# Patient Record
Sex: Female | Born: 1965 | Race: White | Hispanic: No | Smoking: Former smoker
Health system: Southern US, Community
[De-identification: ages and names within clinical notes are randomized; demographics above are authoritative.]

---

## 1998-12-19 ENCOUNTER — Other Ambulatory Visit: Admission: RE | Admit: 1998-12-19 | Discharge: 1998-12-19 | Payer: Self-pay | Admitting: Obstetrics & Gynecology

## 2000-02-23 ENCOUNTER — Other Ambulatory Visit: Admission: RE | Admit: 2000-02-23 | Discharge: 2000-02-23 | Payer: Self-pay | Admitting: Obstetrics & Gynecology

## 2001-09-09 ENCOUNTER — Other Ambulatory Visit: Admission: RE | Admit: 2001-09-09 | Discharge: 2001-09-09 | Payer: Self-pay | Admitting: Obstetrics & Gynecology

## 2002-11-05 ENCOUNTER — Other Ambulatory Visit: Admission: RE | Admit: 2002-11-05 | Discharge: 2002-11-05 | Payer: Self-pay | Admitting: Obstetrics & Gynecology

## 2003-11-15 ENCOUNTER — Other Ambulatory Visit: Admission: RE | Admit: 2003-11-15 | Discharge: 2003-11-15 | Payer: Self-pay | Admitting: Obstetrics & Gynecology

## 2004-02-15 ENCOUNTER — Inpatient Hospital Stay (HOSPITAL_COMMUNITY): Admission: RE | Admit: 2004-02-15 | Discharge: 2004-02-16 | Payer: Self-pay | Admitting: Obstetrics & Gynecology

## 2005-01-03 ENCOUNTER — Other Ambulatory Visit: Admission: RE | Admit: 2005-01-03 | Discharge: 2005-01-03 | Payer: Self-pay | Admitting: Obstetrics & Gynecology

## 2006-02-04 ENCOUNTER — Encounter: Admission: RE | Admit: 2006-02-04 | Discharge: 2006-02-04 | Payer: Self-pay | Admitting: Obstetrics & Gynecology

## 2010-04-29 ENCOUNTER — Encounter: Payer: Self-pay | Admitting: Obstetrics & Gynecology

## 2014-09-28 ENCOUNTER — Other Ambulatory Visit: Payer: Self-pay | Admitting: Obstetrics & Gynecology

## 2014-09-29 LAB — CYTOLOGY - PAP

## 2014-10-01 ENCOUNTER — Other Ambulatory Visit: Payer: Self-pay | Admitting: Obstetrics & Gynecology

## 2014-10-01 DIAGNOSIS — R928 Other abnormal and inconclusive findings on diagnostic imaging of breast: Secondary | ICD-10-CM

## 2014-10-07 ENCOUNTER — Ambulatory Visit
Admission: RE | Admit: 2014-10-07 | Discharge: 2014-10-07 | Disposition: A | Discharge status: Home / Self Care | Payer: BC Managed Care – PPO | Source: Ambulatory Visit | Attending: Obstetrics & Gynecology | Admitting: Obstetrics & Gynecology

## 2014-10-07 DIAGNOSIS — R928 Other abnormal and inconclusive findings on diagnostic imaging of breast: Secondary | ICD-10-CM

## 2021-02-08 ENCOUNTER — Other Ambulatory Visit: Payer: Self-pay

## 2021-02-08 ENCOUNTER — Ambulatory Visit (INDEPENDENT_AMBULATORY_CARE_PROVIDER_SITE_OTHER): Payer: BC Managed Care – PPO

## 2021-02-08 ENCOUNTER — Encounter: Payer: Self-pay | Admitting: Emergency Medicine

## 2021-02-08 ENCOUNTER — Ambulatory Visit: Payer: BC Managed Care – PPO | Admitting: Emergency Medicine

## 2021-02-08 DIAGNOSIS — R053 Chronic cough: Secondary | ICD-10-CM

## 2021-02-08 DIAGNOSIS — J449 Chronic obstructive pulmonary disease, unspecified: Secondary | ICD-10-CM | POA: Diagnosis not present

## 2021-02-08 NOTE — Addendum Note (Signed)
Addended by: Arlyss Repress on: 02/08/2021 10:56 AM   Modules accepted: Orders

## 2021-02-08 NOTE — Assessment & Plan Note (Signed)
Diagnosed clinically.  She does have significant tobacco history.  Suspect she does have some degree of obstructive lung disease given her response to albuterol, possibly also to [facility].  She needs pulmonary function testing to quantify her degree of obstruction.  We may decide to start an alternative bronchodilator going forward.  Continue her albuterol for now.

## 2021-02-08 NOTE — Patient Instructions (Signed)
Will temporarily increase your omeprazole to 20 mg twice a day.  Take this medication 1 hour around food. Keep the head of your bed elevated We will provide you with a GERD prevention diet Stop your Wixela for now.  We will decide whether to restart scheduled inhaler medication in the future. Keep your albuterol available to use 2 puffs when needed for shortness of breath, chest tightness, wheezing. Do your best to avoid throat clearing.  You may benefit from using a sugar-free candy or not methylated cough drop.  When you have the urge to clear your throat, just swallow Try to practice voice rest if possible We will perform full pulmonary function testing in next office visit. Chest x-ray today Follow with Dr. Delton Coombes next available with full pulmonary function testing on the same day.

## 2021-02-08 NOTE — Progress Notes (Signed)
Subjective:    Patient ID: Brenda Key, female    DOB: 03-06-1966, 55 y.o.   MRN: [id number]  HPI 55 year old smoker (19 pack years) with a history of hypertension, GERD, hyperlipidemia, vitamin D deficiency.  She carries a diagnosis of asthma that was made in young adulthood.  She has an albuterol inhaler, Wixela (started 3 months ago), montelukast. Using albuterol less than once a day.  She has cough, often in the am, rarely productive. Can be a barking hard cough. Has to clear her throat a lot - has been present for several years. Was started on omeprazole about 1 month ago. She can intermittently have chest tightness. Over the years has had to have steroids from time to time. She does have reflux, usually in the mornings. No real PND sx.   Chest x-ray 04/27/2020, report available was read as normal at [facility].   Review of Systems As per HPI  PMH: Hypertension GERD Hyperlipidemia Vitamin D deficiency Moderate persistent asthma Obesity Carpal tunnel syndrome   Family history: CAD Father Seizures Brother CAD mother Asthma mother Alzheimer's mother Diabetes maternal aunt   Social History   Socioeconomic History   Marital status: Married    Spouse name: Not on file   Number of children: Not on file   Years of education: Not on file   Highest education level: Not on file  Occupational History   Not on file  Tobacco Use   Smoking status: Not on file   Smokeless tobacco: Not on file  Substance and Sexual Activity   Alcohol use: Not on file   Drug use: Not on file   Sexual activity: Not on file  Other Topics Concern   Not on file  Social History Narrative   Not on file   Social Determinants of Health   Financial Resource Strain: Not on file  Food Insecurity: Not on file  Transportation Needs: Not on file  Physical Activity: Not on file  Stress: Not on file  Social Connections: Not on file  Intimate Partner Violence: Not on file     Not on File    Outpatient Medications Prior to Visit  Medication Sig Dispense Refill   albuterol (VENTOLIN HFA) 108 (90 Base) MCG/ACT inhaler SMARTSIG:2 Puff(s) By Mouth Every 6 Hours PRN     amLODipine (NORVASC) 2.5 MG tablet Take by mouth.     fluticasone-salmeterol (ADVAIR) 100-50 MCG/ACT AEPB Inhale into the lungs.     omeprazole (PRILOSEC) 20 MG capsule Take by mouth.     rosuvastatin (CRESTOR) 5 MG tablet Take 1 tablet by mouth at bedtime.     No facility-administered medications prior to visit.         Objective:   Physical Exam  Vitals:   02/08/21 1020  BP: 118/66  Pulse: 73  Temp: 98.6 F (37 C)  TempSrc: Oral  SpO2: 96%  Weight: 175 lb 9.6 oz (79.7 kg)  Height: 5\' 3"  (1.6 m)    Gen: Pleasant, obese woman, in no distress,  normal affect  ENT: No lesions,  mouth clear,  oropharynx clear, no postnasal drip,  Neck: No JVD, no stridor  Lungs: No use of accessory muscles, no crackles or wheezing on normal respiration, no wheeze on forced expiration  Cardiovascular: RRR, heart sounds normal, no murmur or gallops, no peripheral edema  Musculoskeletal: No deformities, no cyanosis or clubbing  Neuro: alert, awake, non focal  Skin: Warm, no lesions or rash     Assessment & Plan:  Chronic cough 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  She has GERD which is the principal precipitant of her upper airway irritation.  A lot of throat clearing.  We need to try to treat the GERD, tease out how much obstructive lung disease is present.  For now I will stop the [facility] since it is powdered and may cause upper airway irritation.  Will temporarily increase your omeprazole to 20 mg twice a day.  Take this medication 1 hour around food. Keep the head of your bed elevated We will provide you with a GERD prevention diet Stop your Wixela for now.  We will decide whether to restart scheduled inhaler medication in the future. Keep  your albuterol available to use 2 puffs when needed for shortness of breath, chest tightness, wheezing. Do your best to avoid throat clearing.  You may benefit from using a sugar-free candy or not methylated cough drop.  When you have the urge to clear your throat, just swallow Try to practice voice rest if possible We will perform full pulmonary function testing in next office visit. Chest x-ray today Follow with Dr. Delton Coombes next available with full pulmonary function testing on the same day.   COPD with asthma (HCC) Diagnosed clinically.  She does have significant tobacco history.  Suspect she does have some degree of obstructive lung disease given her response to albuterol, possibly also to [facility].  She needs pulmonary function testing to quantify her degree of obstruction.  We may decide to start an alternative bronchodilator going forward.  Continue her albuterol for now.   Levy Pupa, MD, PhD 02/08/2021, 10:49 AM [facility] [phone] or if no answer before 7:00PM call [phone] For any issues after 7:00PM please call eLink [phone]

## 2021-02-08 NOTE — Assessment & Plan Note (Signed)
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  She has GERD which is the principal precipitant of her upper airway irritation.  A lot of throat clearing.  We need to try to treat the GERD, tease out how much obstructive lung disease is present.  For now I will stop the [facility] since it is powdered and may cause upper airway irritation.  Will temporarily increase your omeprazole to 20 mg twice a day.  Take this medication 1 hour around food. Keep the head of your bed elevated We will provide you with a GERD prevention diet Stop your Wixela for now.  We will decide whether to restart scheduled inhaler medication in the future. Keep your albuterol available to use 2 puffs when needed for shortness of breath, chest tightness, wheezing. Do your best to avoid throat clearing.  You may benefit from using a sugar-free candy or not methylated cough drop.  When you have the urge to clear your throat, just swallow Try to practice voice rest if possible We will perform full pulmonary function testing in next office visit. Chest x-ray today Follow with Dr. Delton Coombes next available with full pulmonary function testing on the same day.

## 2021-03-31 ENCOUNTER — Other Ambulatory Visit: Payer: Self-pay | Admitting: *Deleted

## 2021-03-31 DIAGNOSIS — J449 Chronic obstructive pulmonary disease, unspecified: Secondary | ICD-10-CM

## 2021-04-04 ENCOUNTER — Other Ambulatory Visit: Payer: Self-pay

## 2021-04-04 ENCOUNTER — Ambulatory Visit: Payer: BC Managed Care – PPO | Admitting: Emergency Medicine

## 2021-04-04 ENCOUNTER — Encounter: Payer: Self-pay | Admitting: Emergency Medicine

## 2021-04-04 ENCOUNTER — Ambulatory Visit (INDEPENDENT_AMBULATORY_CARE_PROVIDER_SITE_OTHER): Payer: BC Managed Care – PPO | Admitting: Emergency Medicine

## 2021-04-04 DIAGNOSIS — F17210 Nicotine dependence, cigarettes, uncomplicated: Secondary | ICD-10-CM | POA: Diagnosis not present

## 2021-04-04 DIAGNOSIS — R053 Chronic cough: Secondary | ICD-10-CM | POA: Diagnosis not present

## 2021-04-04 DIAGNOSIS — J4489 Other specified chronic obstructive pulmonary disease: Secondary | ICD-10-CM

## 2021-04-04 DIAGNOSIS — J449 Chronic obstructive pulmonary disease, unspecified: Secondary | ICD-10-CM | POA: Diagnosis not present

## 2021-04-04 DIAGNOSIS — Z720 Tobacco use: Secondary | ICD-10-CM

## 2021-04-04 LAB — PULMONARY FUNCTION TEST
DL/VA % pred: 129 %
DL/VA: 5.52 ml/min/mmHg/L
DLCO cor % pred: 103 %
DLCO cor: 20.86 ml/min/mmHg
DLCO unc % pred: 103 %
DLCO unc: 20.86 ml/min/mmHg
FEF 25-75 Post: 1.38 L/sec
FEF 25-75 Pre: 1.4 L/sec
FEF2575-%Change-Post: -1 %
FEF2575-%Pred-Post: 56 %
FEF2575-%Pred-Pre: 57 %
FEV1-%Change-Post: -1 %
FEV1-%Pred-Post: 68 %
FEV1-%Pred-Pre: 69 %
FEV1-Post: 1.77 L
FEV1-Pre: 1.8 L
FEV1FVC-%Change-Post: 1 %
FEV1FVC-%Pred-Pre: 96 %
FEV6-%Change-Post: -3 %
FEV6-%Pred-Post: 71 %
FEV6-%Pred-Pre: 73 %
FEV6-Post: 2.29 L
FEV6-Pre: 2.36 L
FEV6FVC-%Pred-Post: 103 %
FEV6FVC-%Pred-Pre: 103 %
FVC-%Change-Post: -3 %
FVC-%Pred-Post: 68 %
FVC-%Pred-Pre: 70 %
FVC-Post: 2.29 L
FVC-Pre: 2.36 L
Post FEV1/FVC ratio: 77 %
Post FEV6/FVC ratio: 100 %
Pre FEV1/FVC ratio: 76 %
Pre FEV6/FVC Ratio: 100 %
RV % pred: 154 %
RV: 2.95 L
TLC % pred: 103 %
TLC: 5.17 L

## 2021-04-04 MED ORDER — BREZTRI AEROSPHERE 160-9-4.8 MCG/ACT IN AERO: 2.0000 | INHALATION_SPRAY | Freq: Two times a day (BID) | RESPIRATORY_TRACT | 0 refills | Status: DC

## 2021-04-04 MED ORDER — BREZTRI AEROSPHERE 160-9-4.8 MCG/ACT IN AERO: 2.0000 | INHALATION_SPRAY | Freq: Two times a day (BID) | RESPIRATORY_TRACT | 5 refills | Status: DC

## 2021-04-04 NOTE — Assessment & Plan Note (Signed)
Try to start taking your omeprazole 20 mg twice a day reliably on a schedule.  Take 1 hour around food.  This should help your reflux and also your chronic cough.

## 2021-04-04 NOTE — Assessment & Plan Note (Addendum)
Moderately severe obstruction noted on her pulmonary function testing.  She does have daily symptoms, mostly cough but also some dyspnea.  She flares twice annually.  I think she would benefit from LABA/LAMA/ICS.  I want to stay away from powdered formulation.   We will try starting a new inhaler called Breztri to see if you get benefit.  Take 2 puffs twice a day on a schedule.  Take it every day as a maintenance inhaler.  Rinse and gargle after using.  Keep track of whether this medication helps you, and call to let us know.  If you benefit we will send a prescription to your pharmacy. Keep albuterol available to use 2 puffs if you need it for shortness of breath, chest tightness, wheezing. We talked about smoking cessation today.  We set a goal to get down to 5 cigarettes daily by your next visit. Follow with Dr Delton Coombes in 3 months or sooner if you have any problems.

## 2021-04-04 NOTE — Addendum Note (Signed)
Addended by: Wyvonne Lenz on: 04/04/2021 11:48 AM   Modules accepted: Orders

## 2021-04-04 NOTE — Progress Notes (Signed)
Full PFT completed today ? ?

## 2021-04-04 NOTE — Patient Instructions (Signed)
Try to start taking your omeprazole 20 mg twice a day reliably on a schedule.  Take 1 hour around food.  This should help your reflux and also your chronic cough. We will try starting a new inhaler called Breztri to see if you get benefit.  Take 2 puffs twice a day on a schedule.  Take it every day as a maintenance inhaler.  Rinse and gargle after using.  Keep track of whether this medication helps you, and call to let us know.  If you benefit we will send a prescription to your pharmacy. Keep albuterol available to use 2 puffs if you need it for shortness of breath, chest tightness, wheezing. We talked about smoking cessation today.  We set a goal to get down to 5 cigarettes daily by your next visit. Follow with Dr Delton Coombes in 3 months or sooner if you have any problems.

## 2021-04-04 NOTE — Progress Notes (Signed)
Subjective:    Patient ID: Brenda Key, female    DOB: 06-Nov-1965, 55 y.o.   MRN: [id number]  HPI 55 year old smoker (19 pack years) with a history of hypertension, GERD, hyperlipidemia, vitamin D deficiency.  She carries a diagnosis of asthma that was made in young adulthood.  She has an albuterol inhaler, Wixela (started 3 months ago), montelukast. Using albuterol less than once a day.  She has cough, often in the am, rarely productive. Can be a barking hard cough. Has to clear her throat a lot - has been present for several years. Was started on omeprazole about 1 month ago. She can intermittently have chest tightness. Over the years has had to have steroids from time to time. She does have reflux, usually in the mornings. No real PND sx.   Chest x-ray 04/27/2020, report available was read as normal at [facility].  ROV 04/04/21 --55 year old woman with history of tobacco use and asthma.  Also with hypertension, GERD.  I saw her in early November for chronic cough possibly due to mixed upper and lower airway contributors.  I stopped her Wixela, planned to increase her omeprazole to twice daily, didn't do this.  Arranged for pulmonary function testing which were done today as below. She believes that she is missing the [place], her albuterol use has gone up. She has flares about 2x a year.   Chest x-ray 02/08/2021 reviewed by me showed some bilateral interstitial prominence  Pulmonary function testing done today reviewed by me, show moderately severe obstruction without a bronchodilator response.  She has hyperinflated lung volumes.  Normal diffusion capacity.   Review of Systems As per HPI      Objective:   Physical Exam  Vitals:   04/04/21 1107  BP: 122/78  Pulse: 70  Temp: 98.1 F (36.7 C)  TempSrc: Oral  SpO2: 96%  Weight: 172 lb 3.2 oz (78.1 kg)  Height: 5' 3.5" (1.613 m)     Gen: Pleasant, obese woman, in no distress,  normal affect  ENT: No lesions,  mouth clear,   oropharynx clear, no postnasal drip,  Neck: No JVD, no stridor  Lungs: No use of accessory muscles, no crackles or wheezing on normal respiration, no wheeze on forced expiration  Cardiovascular: RRR, heart sounds normal, no murmur or gallops, no peripheral edema  Musculoskeletal: No deformities, no cyanosis or clubbing  Neuro: alert, awake, non focal  Skin: Warm, no lesions or rash     Assessment & Plan:   COPD with asthma (HCC) Moderately severe obstruction noted on her pulmonary function testing.  She does have daily symptoms, mostly cough but also some dyspnea.  She flares twice annually.  I think she would benefit from LABA/LAMA/ICS.  I want to stay away from powdered formulation.   We will try starting a new inhaler called Breztri to see if you get benefit.  Take 2 puffs twice a day on a schedule.  Take it every day as a maintenance inhaler.  Rinse and gargle after using.  Keep track of whether this medication helps you, and call to let us know.  If you benefit we will send a prescription to your pharmacy. Keep albuterol available to use 2 puffs if you need it for shortness of breath, chest tightness, wheezing. We talked about smoking cessation today.  We set a goal to get down to 5 cigarettes daily by your next visit. Follow with Dr Delton Coombes in 3 months or sooner if you have any problems.  Chronic  cough Try to start taking your omeprazole 20 mg twice a day reliably on a schedule.  Take 1 hour around food.  This should help your reflux and also your chronic cough.    Levy Pupa, MD, PhD 04/04/2021, 11:25 AM [facility] [phone] or if no answer before 7:00PM call [phone] For any issues after 7:00PM please call eLink [phone]

## 2021-05-22 NOTE — Progress Notes (Signed)
Synopsis: Referred for ACOS by Candi Leash, MD  Subjective:   PATIENT ID: Brenda Key GENDER: female DOB: 04/06/1966, MRN: [id number]  Chief Complaint  Patient presents with   Acute Visit    Cough x 3 wks- prod with moderate brown sputum- albuterol neb helps. She also has some minimal chest tightness. She has some pressure/pain around her eyes.    56yF with ACOS, chronic cough, smoking 19py, GERD  She was last seen 04/04/21 by Dr. Delton Coombes at which point breztri 2 puffs BID started, smoking cessation encouraged, omeprazole 20 BID started for cough.  PFTs 03/2021 with normal FEV1/FVC, FEV1/SVC. There is air trapping, normal DLCO.  Has had productive cough for about a month. About 3-4 weeks ago went on prednisone, zpack from PCP and didn't seem to make a difference. No fever. Some CP when she coughs. Breathing isn't necessarily any worse than at last visit.   She also mentions eye pain over this last month. Occasional photophobia. No foreign body sensation. No change in visual acuity.   She is taking omeprazole 20 BID.   Still smoking, down to ~6-7 cigarettes/day.   Otherwise pertinent review of systems is negative.  PMH reviewed   Family history reviewed    Social History   Socioeconomic History   Marital status: Married    Spouse name: Not on file   Number of children: Not on file   Years of education: Not on file   Highest education level: Not on file  Occupational History   Not on file  Tobacco Use   Smoking status: Former    Packs/day: 0.50    Years: 38.00    Pack years: 19.00    Types: Cigarettes   Smokeless tobacco: Never  Substance and Sexual Activity   Alcohol use: Not on file   Drug use: Not on file   Sexual activity: Not on file  Other Topics Concern   Not on file  Social History Narrative   Not on file   Social Determinants of Health   Financial Resource Strain: Not on file  Food Insecurity: Not on file  Transportation Needs: Not on file   Physical Activity: Not on file  Stress: Not on file  Social Connections: Not on file  Intimate Partner Violence: Not on file     No Known Allergies   Outpatient Medications Prior to Visit  Medication Sig Dispense Refill   albuterol (PROVENTIL) (2.5 MG/3ML) 0.083% nebulizer solution USE 1 VIAL PER NEBULIZER EVERY 6 HOURS AS NEEDED FOR WHEEZING     albuterol (VENTOLIN HFA) 108 (90 Base) MCG/ACT inhaler SMARTSIG:2 Puff(s) By Mouth Every 6 Hours PRN     amLODipine (NORVASC) 2.5 MG tablet Take by mouth.     Budeson-Glycopyrrol-Formoterol (BREZTRI AEROSPHERE) 160-9-4.8 MCG/ACT AERO Inhale 2 puffs into the lungs in the morning and at bedtime. 10.7 g 5   fluticasone (FLONASE) 50 MCG/ACT nasal spray two sprays by Both Nostrils route daily.     meloxicam (MOBIC) 7.5 MG tablet Take by mouth.     omeprazole (PRILOSEC) 20 MG capsule Take by mouth.     rosuvastatin (CRESTOR) 5 MG tablet Take 1 tablet by mouth at bedtime.     Budeson-Glycopyrrol-Formoterol (BREZTRI AEROSPHERE) 160-9-4.8 MCG/ACT AERO Inhale 2 puffs into the lungs in the morning and at bedtime. 5.9 g 0   No facility-administered medications prior to visit.       Objective:   Physical Exam:  General appearance: 56 y.o., female, NAD, conversant, female, NAD, conversant  Eyes: sclerae  look a little injected; PERRL, tracking appropriately HENT: NCAT; MMM Neck: Trachea midline; no lymphadenopathy, no JVD Lungs: CTAB, no crackles, no wheeze, with normal respiratory effort CV: RRR, no murmur  Abdomen: Soft, non-tender; non-distended, BS present  Extremities: No peripheral edema, warm Skin: Normal turgor and texture; no rash Psych: Appropriate affect Neuro: Alert and oriented to person and place, no focal deficit     Vitals:   05/23/21 1036 05/23/21 1052  BP: (!) 150/78 138/74  Pulse: 80   Temp: 98.1 F (36.7 C)   TempSrc: Oral   SpO2: 95%   Weight: 175 lb 6.4 oz (79.6 kg)   Height: 5' 3.5" (1.613 m)    95% on RA BMI Readings from Last 3  Encounters:  05/23/21 30.58 kg/m  04/04/21 30.03 kg/m  02/08/21 31.11 kg/m   Wt Readings from Last 3 Encounters:  05/23/21 175 lb 6.4 oz (79.6 kg)  04/04/21 172 lb 3.2 oz (78.1 kg)  02/08/21 175 lb 9.6 oz (79.7 kg)     CBC No results found for: WBC, RBC, HGB, HCT, PLT, MCV, MCH, MCHC, RDW, LYMPHSABS, MONOABS, EOSABS, BASOSABS  CBC reviewed by me with 300 eos  Chest Imaging: CXR 02/08/21 reviewed by me - agree with radiology interpretation with bilateral lower lobe predominant reticulonodular opacities  CXR today reviewed by me with ?reticular RUL opacity also seen on prior CXR, awaiting final read   Pulmonary Functions Testing Results: PFT Results Latest Ref Rng & Units 04/04/2021  FVC-Pre L 2.36  FVC-Predicted Pre % 70  FVC-Post L 2.29  FVC-Predicted Post % 68  Pre FEV1/FVC % % 76  Post FEV1/FCV % % 77  FEV1-Pre L 1.80  FEV1-Predicted Pre % 69  FEV1-Post L 1.77  DLCO uncorrected ml/min/mmHg 20.86  DLCO UNC% % 103  DLCO corrected ml/min/mmHg 20.86  DLCO COR %Predicted % 103  DLVA Predicted % 129  TLC L 5.17  TLC % Predicted % 103  RV % Predicted % 154   Air trapping, normal FEV1/FVC, FEV1/SVC ratios. No significant BD response.         Assessment & Plan:   # Acute on chronic cough:  # Acute eye pain Unclear etiology. Sclera may be a little red but subtle. Photophobia only intermittent. No foreign body sensation, no blurriness/change in visual acuity and not clearly related to timing of initiation of breztri.  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  - she has already arranged appointment with ophthalmologist for eye pain, ED precautions given   RTC 3/28 with Dr. Renford Dills, MD  Pulmonary Critical Care 05/23/2021 12:41 PM

## 2021-05-23 ENCOUNTER — Other Ambulatory Visit: Payer: Self-pay

## 2021-05-23 ENCOUNTER — Ambulatory Visit: Payer: BC Managed Care – PPO | Admitting: Student

## 2021-05-23 ENCOUNTER — Encounter: Payer: Self-pay | Admitting: Student

## 2021-05-23 ENCOUNTER — Ambulatory Visit (INDEPENDENT_AMBULATORY_CARE_PROVIDER_SITE_OTHER): Payer: BC Managed Care – PPO

## 2021-05-23 VITALS — BP 138/74 | HR 80 | Temp 98.1°F | Ht 63.5 in | Wt 175.4 lb

## 2021-05-23 DIAGNOSIS — R053 Chronic cough: Secondary | ICD-10-CM | POA: Diagnosis not present

## 2021-05-23 DIAGNOSIS — F17210 Nicotine dependence, cigarettes, uncomplicated: Secondary | ICD-10-CM

## 2021-05-23 DIAGNOSIS — J449 Chronic obstructive pulmonary disease, unspecified: Secondary | ICD-10-CM

## 2021-05-23 DIAGNOSIS — J209 Acute bronchitis, unspecified: Secondary | ICD-10-CM | POA: Diagnosis not present

## 2021-05-23 DIAGNOSIS — F17200 Nicotine dependence, unspecified, uncomplicated: Secondary | ICD-10-CM

## 2021-05-23 MED ORDER — DOXYCYCLINE HYCLATE 100 MG PO TABS: 100.0000 mg | ORAL_TABLET | Freq: Two times a day (BID) | ORAL | 0 refills | Status: DC

## 2021-05-23 NOTE — Patient Instructions (Signed)
-   keep using breztri inhaler 2 puffs twice daily, rinse mouth after use - flutter valve 10 slow but firm puffs twice daily  - X ray today, I'll call in antibiotic afterward

## 2021-05-30 ENCOUNTER — Ambulatory Visit (INDEPENDENT_AMBULATORY_CARE_PROVIDER_SITE_OTHER)
Admission: RE | Admit: 2021-05-30 | Discharge: 2021-05-30 | Disposition: A | Discharge status: Home / Self Care | Payer: BC Managed Care – PPO | Source: Ambulatory Visit | Attending: Student | Admitting: Student

## 2021-05-30 ENCOUNTER — Other Ambulatory Visit: Payer: Self-pay

## 2021-05-30 DIAGNOSIS — R053 Chronic cough: Secondary | ICD-10-CM | POA: Diagnosis not present

## 2021-06-01 ENCOUNTER — Telehealth: Payer: Self-pay | Admitting: Student

## 2021-06-01 NOTE — Telephone Encounter (Signed)
Spoke to patient, who is requesting CT 2/21/22023.  Dr. Verlee Monte, please advise. Thanks

## 2021-06-01 NOTE — Telephone Encounter (Signed)
Called and discussed ct chest results. Recommended checking ct in a year which I've ordered - she is high enough risk for lung cancer to warrant follow up based on her smoking history although does not qualify for

## 2021-07-04 ENCOUNTER — Ambulatory Visit: Payer: BC Managed Care – PPO | Admitting: Emergency Medicine

## 2021-07-11 ENCOUNTER — Telehealth: Payer: Self-pay | Admitting: Emergency Medicine

## 2021-07-11 ENCOUNTER — Encounter: Payer: Self-pay | Admitting: Emergency Medicine

## 2021-07-11 ENCOUNTER — Ambulatory Visit: Payer: BC Managed Care – PPO | Admitting: Emergency Medicine

## 2021-07-11 DIAGNOSIS — J449 Chronic obstructive pulmonary disease, unspecified: Secondary | ICD-10-CM | POA: Diagnosis not present

## 2021-07-11 DIAGNOSIS — E041 Nontoxic single thyroid nodule: Secondary | ICD-10-CM | POA: Diagnosis not present

## 2021-07-11 DIAGNOSIS — R918 Other nonspecific abnormal finding of lung field: Secondary | ICD-10-CM

## 2021-07-11 DIAGNOSIS — Z720 Tobacco use: Secondary | ICD-10-CM

## 2021-07-11 DIAGNOSIS — R053 Chronic cough: Secondary | ICD-10-CM | POA: Diagnosis not present

## 2021-07-11 DIAGNOSIS — J4489 Other specified chronic obstructive pulmonary disease: Secondary | ICD-10-CM

## 2021-07-11 MED ORDER — FLUTICASONE-SALMETEROL 100-50 MCG/ACT IN AEPB: 1.0000 | INHALATION_SPRAY | Freq: Two times a day (BID) | RESPIRATORY_TRACT | 5 refills | Status: DC

## 2021-07-11 NOTE — Assessment & Plan Note (Signed)
She improved after recent doxycycline, bronchitic symptoms improved.  She still has congestion, drainage and cough impacted by this as well as breakthrough GERD.  She did not benefit from the [place], felt that it made her worse so she is back on [place] and is tolerating.  Only using once daily. ? ? ?Continue Wixela 1 inhalation once daily.  Rinse and gargle after using. ?Keep your albuterol available to use 2 puffs if needed for shortness of breath, chest tightness, wheezing. ?Follow with Dr Delton Coombes in 6 months or sooner if you have any problems ?

## 2021-07-11 NOTE — Progress Notes (Signed)
? ?Subjective:  ? ? Patient ID: Brenda Key, female    DOB: 02-Aug-1965, 56 y.o.   MRN: [id number] ? ?HPI ? ?ROV 04/04/21 --56 year old woman with history of tobacco use and asthma.  Also with hypertension, GERD.  I saw her in early November for chronic cough possibly due to mixed upper and lower airway contributors.  I stopped her Wixela, planned to increase her omeprazole to twice daily, didn't do this.  Arranged for pulmonary function testing which were done today as below. She believes that she is missing the [place], her albuterol use has gone up. She has flares about 2x a year.  ? ?Chest x-ray 02/08/2021 reviewed by me showed some bilateral interstitial prominence ? ?Pulmonary function testing done today reviewed by me, show moderately severe obstruction without a bronchodilator response.  She has hyperinflated lung volumes.  Normal diffusion capacity. ? ? ?ROV 07/11/21 --follow-up visit 56 year old woman with a history of tobacco use (20 pack years), smoking approximately.  She hands COPD/asthma, chronic cough with contributions GERD, chronic rhinitis.  She was seen 05/23/2021 in our office, treated for an acute bronchitis with doxycycline.  A chest x-ray has shown a reticular right upper lobe opacity which prompted a CT chest as below.  She was continued on omeprazole and Breztri.  She reports that the doxycycline cleared her chest some. Her GERD has been flaring, and then her cough. She stopped the Breztri - seemed to be hurting her chest. She is back on wixella now. Uses albuterol rarely.  ?She is off flonase, off singulair. She is using nasal saline spray.  ? ?CT chest 05/30/2021 reviewed by me, shows an enlarged left thyroid with multiple solid nodules, small scattered subpleural nodules measuring up to 3 mm at the left base.  A thyroid ultrasound was recommended. ? ? ?Review of Systems ?As per HPI ? ?   ?Objective:  ? Physical Exam ? ?Vitals:  ? 07/11/21 1419  ?BP: 136/80  ?Pulse: (!) 108  ?Temp: 99.1 ?F (37.3  ?C)  ?TempSrc: Oral  ?SpO2: 96%  ?Weight: 170 lb (77.1 kg)  ?Height: 5' 4.5" (1.638 m)  ? ? ?Gen: Pleasant, obese woman, in no distress,  normal affect ? ?ENT: No lesions,  mouth clear,  oropharynx clear, no postnasal drip, ? ?Neck: No JVD, no stridor ? ?Lungs: No use of accessory muscles, no crackles or wheezing on normal respiration, no wheeze on forced expiration ? ?Cardiovascular: RRR, heart sounds normal, no murmur or gallops, no peripheral edema ? ?Musculoskeletal: No deformities, no cyanosis or clubbing ? ?Neuro: alert, awake, non focal ? ?Skin: Warm, no lesions or rash ? ?   ?Assessment & Plan:  ? ?COPD with asthma (HCC) ?She improved after recent doxycycline, bronchitic symptoms improved.  She still has congestion, drainage and cough impacted by this as well as breakthrough GERD.  She did not benefit from the [place], felt that it made her worse so she is back on [place] and is tolerating.  Only using once daily. ? ? ?Continue Wixela 1 inhalation once daily.  Rinse and gargle after using. ?Keep your albuterol available to use 2 puffs if needed for shortness of breath, chest tightness, wheezing. ?Follow with Dr Delton Coombes in 6 months or sooner if you have any problems ? ?Chronic cough ?With the impact of rhinitis, only partially treated.  Also GERD and she is having significant breakthrough.  We will change her PPI to pantoprazole.  Discussed restarting any of the allergy medications that she can tolerate.  Many  of these have given her side effects or made her feel worse instead of better. ? ?Continue your nasal saline spray as you have been using it. ?Consider starting loratadine 10 mg (generic Claritin) once daily. ?You could also consider restarting your fluticasone (Flonase) nasal spray, 2 sprays each nostril once daily. ?Stop your omeprazole.  Start pantoprazole 40 mg twice a day.  Take this medication 1 hour around food. ? ?Pulmonary nodules ?Punctate nodules noted on her CT chest 05/30/2021.  She has a  smoking history and I think is reasonable to do surveillance.  We will follow for 2 years.  If stable then we could transition her to the lung cancer screening program.  Neck scan in February 2024 ? ?Thyroid nodule ?Identified on her CT chest 05/30/2021.  She has a thyroid ultrasound arranged and will follow up with her PCP. ? ?Tobacco use ?She has cut down.  Congratulated her on this.  Discussed the benefits of complete cessation ? ?Time spent 41 minutes ? ?Levy Pupa, MD, PhD ?07/11/2021, 2:47 PM ?[facility] ?[phone] or if no answer before 7:00PM call [phone] ?For any issues after 7:00PM please call eLink [phone] ? ?

## 2021-07-11 NOTE — Assessment & Plan Note (Signed)
She has cut down.  Congratulated her on this.  Discussed the benefits of complete cessation ?

## 2021-07-11 NOTE — Patient Instructions (Addendum)
Continue Wixela 1 inhalation once daily.  Rinse and gargle after using. ?Keep your albuterol available to use 2 puffs if needed for shortness of breath, chest tightness, wheezing. ?Continue your nasal saline spray as you have been using it. ?Consider starting loratadine 10 mg (generic Claritin) once daily. ?You could also consider restarting your fluticasone (Flonase) nasal spray, 2 sprays each nostril once daily. ?Stop your omeprazole.  Start pantoprazole 40 mg twice a day.  Take this medication 1 hour around food. ?We will plan to repeat your CT scan of the chest in February 2024 to follow small pulmonary nodules. ?Continue to work on decreasing your smoking.  Ultimate goal will be to stop altogether. ?Follow with Dr Delton Coombes in 6 months or sooner if you have any problems ? ?

## 2021-07-11 NOTE — Addendum Note (Signed)
Addended by: Dorisann Frames R on: 07/11/2021 02:55 PM ? ? Modules accepted: Orders ? ?

## 2021-07-11 NOTE — Assessment & Plan Note (Signed)
Identified on her CT chest 05/30/2021.  She has a thyroid ultrasound arranged and will follow up with her PCP. ?

## 2021-07-11 NOTE — Assessment & Plan Note (Signed)
Punctate nodules noted on her CT chest 05/30/2021.  She has a smoking history and I think is reasonable to do surveillance.  We will follow for 2 years.  If stable then we could transition her to the lung cancer screening program.  Neck scan in February 2024 ?

## 2021-07-11 NOTE — Assessment & Plan Note (Signed)
With the impact of rhinitis, only partially treated.  Also GERD and she is having significant breakthrough.  We will change her PPI to pantoprazole.  Discussed restarting any of the allergy medications that she can tolerate.  Many of these have given her side effects or made her feel worse instead of better. ? ?Continue your nasal saline spray as you have been using it. ?Consider starting loratadine 10 mg (generic Claritin) once daily. ?You could also consider restarting your fluticasone (Flonase) nasal spray, 2 sprays each nostril once daily. ?Stop your omeprazole.  Start pantoprazole 40 mg twice a day.  Take this medication 1 hour around food. ?

## 2021-07-12 MED ORDER — PANTOPRAZOLE SODIUM 40 MG PO TBEC: 40.0000 mg | DELAYED_RELEASE_TABLET | Freq: Every day | ORAL | 5 refills | Status: DC

## 2021-07-12 NOTE — Telephone Encounter (Signed)
Called and spoke with pt about Rx that needed to be sent to pharmacy and have sent Rx for pantoprazole to preferred pharmacy for pt. Nothing further needed. ?

## 2021-11-05 ENCOUNTER — Other Ambulatory Visit: Payer: Self-pay | Admitting: Emergency Medicine

## 2022-01-20 ENCOUNTER — Other Ambulatory Visit: Payer: Self-pay | Admitting: Emergency Medicine

## 2022-04-05 ENCOUNTER — Other Ambulatory Visit: Payer: Self-pay | Admitting: Emergency Medicine

## 2022-05-14 ENCOUNTER — Telehealth: Payer: Self-pay | Admitting: Emergency Medicine

## 2022-05-14 NOTE — Telephone Encounter (Signed)
Insurance will not covere Advair. They are willing to cover Wixela or Budesonide. Please advise if you want to change patients inhaler. We can try an see if a prior authorization will help?

## 2022-05-15 NOTE — Telephone Encounter (Signed)
Patient states pharmacy is [facility]. Patient phone number is [phone].

## 2022-05-15 NOTE — Telephone Encounter (Signed)
ATC X1 LVM for patient to call the office back with preferred pharmacy so we can send in [place]. Will leave encounter open until patient calls back

## 2022-05-15 NOTE — Telephone Encounter (Signed)
Recommend changing her to [place], same dosing

## 2022-05-16 MED ORDER — FLUTICASONE-SALMETEROL 250-50 MCG/ACT IN AEPB: 1.0000 | INHALATION_SPRAY | Freq: Two times a day (BID) | RESPIRATORY_TRACT | 4 refills | Status: DC

## 2022-05-16 NOTE — Telephone Encounter (Signed)
Called patient and went over medication with her and she verified understanding. She asked about the address for her CT scan on 2/12. Gave her the address. Nothing further needed

## 2022-05-16 NOTE — Telephone Encounter (Signed)
Wixela 250-50, bid

## 2022-05-17 ENCOUNTER — Telehealth: Payer: Self-pay | Admitting: Nurse Practitioner

## 2022-05-17 NOTE — Telephone Encounter (Signed)
Called and spoke with patient. She was wondering why she needed to have the CT scan on Monday. I advised her the scan was to look at the lung nodule from last year to make sure it hadn't change size. She stated that she had a biopsy done back in September and was told to follow up in a year. I explained to her that was for the thyroid nodule and that was done with endocrinology. She verbalized understanding and will keep the appt for Monday.   Nothing further needed at time of call.

## 2022-05-18 ENCOUNTER — Telehealth: Payer: Self-pay | Admitting: Emergency Medicine

## 2022-05-21 ENCOUNTER — Other Ambulatory Visit: Payer: BC Managed Care – PPO

## 2022-05-21 NOTE — Telephone Encounter (Signed)
Order reprinted to get auth gave to [facility] to precert

## 2022-05-25 ENCOUNTER — Ambulatory Visit: Payer: BC Managed Care – PPO | Admitting: Nurse Practitioner

## 2022-10-02 ENCOUNTER — Ambulatory Visit: Payer: BC Managed Care – PPO | Admitting: Primary Care

## 2022-10-02 ENCOUNTER — Encounter: Payer: Self-pay | Admitting: Primary Care

## 2022-10-02 VITALS — BP 132/72 | HR 80 | Temp 98.7°F | Ht 64.0 in | Wt 172.4 lb

## 2022-10-02 DIAGNOSIS — J310 Chronic rhinitis: Secondary | ICD-10-CM | POA: Insufficient documentation

## 2022-10-02 DIAGNOSIS — R918 Other nonspecific abnormal finding of lung field: Secondary | ICD-10-CM | POA: Diagnosis not present

## 2022-10-02 DIAGNOSIS — R053 Chronic cough: Secondary | ICD-10-CM

## 2022-10-02 DIAGNOSIS — J302 Other seasonal allergic rhinitis: Secondary | ICD-10-CM

## 2022-10-02 DIAGNOSIS — J4489 Other specified chronic obstructive pulmonary disease: Secondary | ICD-10-CM

## 2022-10-02 DIAGNOSIS — K219 Gastro-esophageal reflux disease without esophagitis: Secondary | ICD-10-CM

## 2022-10-02 MED ORDER — IPRATROPIUM BROMIDE 0.03 % NA SOLN: 2.0000 | Freq: Two times a day (BID) | NASAL | 12 refills | Status: DC

## 2022-10-02 MED ORDER — PROMETHAZINE-DM 6.25-15 MG/5ML PO SYRP: 5.0000 mL | ORAL_SOLUTION | Freq: Four times a day (QID) | ORAL | 0 refills | Status: DC | PRN

## 2022-10-02 NOTE — Progress Notes (Addendum)
@Patient  ID: Brenda Key, female    DOB: 01/23/1966, 57 y.o.   MRN: [id number]  Chief Complaint  Patient presents with   Follow-up    Laryngitis x 3 weeks.  Phlegm in throat.    Referring provider: Candi Leash, MD  HPI: 57 year old female, everyday smoker. PMH significant for COPD with asthma, pulmonary nodules, fibroid nodule, chronic cough.  Patient of Dr. Delton Coombes.   10/02/2022- Interim hx  Patient presents today for acute office visit due to voice hoarseness.  She was last seen back in April 2023.  Maintained on Wixela and as needed albuterol. It was recommended patient take loratadine and fluticasone nasal spray.  During last office visit reflux medication was changed from omeprazole to pantoprazole 40 mg twice daily.  She has been doing alright the last year. She is still smoking. She developed voice hoarseness along with upper airway cough three weeks ago. She has mucus in the back of her throat but has a hard time getting anything up. Dr. Jonny Ruiz put her on prednisone which did not particularly help. She started wixella in February, she does not take it every day. She is unsure if powder inhaler is making her cough worse. She is not currently using any nasal sprays. She restarted Singulair this morning. She tells me that her acid reflux is terrible. She takes pantoprazole once daily.   New dx of mild sleep apnea, AHI 8/hour. She was wearing and tolerating cpap but stopped using three weeks ago due to voice hoarseness, this break did not help symptoms and we discussed resuming. She does use humidification and regularly clean her machine.   No Known Allergies  Immunization History  Administered Date(s) Administered   Moderna Sars-Covid-2 Vaccination 12/12/2019, 01/15/2020   PNEUMOCOCCAL CONJUGATE-20 03/15/2021    No past medical history on file.  Tobacco History: Social History   Tobacco Use  Smoking Status Former   Packs/day: 0.50   Years: 38.00   Additional pack years:  0.00   Total pack years: 19.00   Types: Cigarettes   Quit date: 05/20/2022   Years since quitting: 0.3  Smokeless Tobacco Never  Tobacco Comments   1 pack of cigarettes last 3 days ARJ 07/11/21.  Now quit date 05/20/2022.  Updated 10/02/2022 am   Counseling given: Not Answered Tobacco comments: 1 pack of cigarettes last 3 days ARJ 07/11/21.  Now quit date 05/20/2022.  Updated 10/02/2022 am   Outpatient Medications Prior to Visit  Medication Sig Dispense Refill   albuterol (PROVENTIL) (2.5 MG/3ML) 0.083% nebulizer solution USE 1 VIAL PER NEBULIZER EVERY 6 HOURS AS NEEDED FOR WHEEZING     albuterol (VENTOLIN HFA) 108 (90 Base) MCG/ACT inhaler SMARTSIG:2 Puff(s) By Mouth Every 6 Hours PRN     amLODipine (NORVASC) 2.5 MG tablet Take by mouth.     ammonium lactate (LAC-HYDRIN) 12 % lotion Apply 1 Application topically as needed for dry skin.     Bempedoic Acid (NEXLETOL) 180 MG TABS Take 1 tablet by mouth daily.     ezetimibe (ZETIA) 10 MG tablet Take 10 mg by mouth daily.     fluticasone (FLONASE) 50 MCG/ACT nasal spray two sprays by Both Nostrils route daily.     fluticasone-salmeterol (WIXELA INHUB) 250-50 MCG/ACT AEPB Inhale 1 puff into the lungs in the morning and at bedtime. 60 each 4   meloxicam (MOBIC) 7.5 MG tablet Take by mouth.     metformin (FORTAMET) 500 MG (OSM) 24 hr tablet Take 500 mg by mouth 2 (two) times  daily with a meal.     pantoprazole (PROTONIX) 40 MG tablet TAKE 1 TABLET BY MOUTH EVERY DAY 90 tablet 1   doxycycline (VIBRA-TABS) 100 MG tablet Take 1 tablet (100 mg total) by mouth 2 (two) times daily. (Patient not taking: Reported on 10/02/2022) 14 tablet 0   rosuvastatin (CRESTOR) 5 MG tablet Take 1 tablet by mouth at bedtime. (Patient not taking: Reported on 10/02/2022)     No facility-administered medications prior to visit.   Review of Systems  Review of Systems  Constitutional: Negative.   HENT:  Positive for postnasal drip.   Respiratory:  Positive for cough. Negative  for chest tightness, shortness of breath and wheezing.    Physical Exam  BP 132/72 (BP Location: Left Arm, Patient Position: Sitting, Cuff Size: Large)   Pulse 80   Temp 98.7 F (37.1 C) (Oral)   Ht 5\' 4"  (1.626 m)   Wt 172 lb 6.4 oz (78.2 kg)   SpO2 97%   BMI 29.59 kg/m  Physical Exam Constitutional:      Appearance: Normal appearance.  HENT:     Head: Normocephalic and atraumatic.     Right Ear: Tympanic membrane normal.     Left Ear: Tympanic membrane normal.     Nose: Nose normal. No congestion.     Mouth/Throat:     Mouth: Mucous membranes are moist.     Pharynx: Oropharynx is clear.  Cardiovascular:     Rate and Rhythm: Normal rate and regular rhythm.  Pulmonary:     Effort: Pulmonary effort is normal.     Breath sounds: Normal breath sounds. No wheezing, rhonchi or rales.  Skin:    General: Skin is warm and dry.  Neurological:     General: No focal deficit present.     Mental Status: She is alert and oriented to person, place, and time. Mental status is at baseline.  Psychiatric:        Mood and Affect: Mood normal.        Behavior: Behavior normal.        Thought Content: Thought content normal.        Judgment: Judgment normal.      Lab Results:  CBC No results found for: "WBC", "RBC", "HGB", "HCT", "PLT", "MCV", "MCH", "MCHC", "RDW", "LYMPHSABS", "MONOABS", "EOSABS", "BASOSABS"  BMET No results found for: "NA", "K", "CL", "CO2", "GLUCOSE", "BUN", "CREATININE", "CALCIUM", "GFRNONAA", "GFRAA"  BNP No results found for: "BNP"  ProBNP No results found for: "PROBNP"  Imaging: No results found.   Assessment & Plan:   COPD with asthma (HCC) - New voice hoarseness with upper airway cough x3 weeks. No improvement with oral prednisone. PND and GERD likely contributing. Lungs were clear on exam. No audible wheezing. Continue Wixella, prn Albuterol and Singulair. Recommend max treatment for GERD/PND. RX promethazine-dm 5ml every 6 hours for cough  suppression. Needs updated PFT with BD challenge prior to next visit. FU in 3 months or sooner if needed.   Rhinitis - Continue Singulair 10mg  at bedtime - Start ipratropium nasal spray q 12 hours   GERD (gastroesophageal reflux disease) - Uncontrolled - Advised patient take pantoprazole 40mg  twice daily, follow GERD diet  Pulmonary nodules - Smoking hx. Punctate nodules noted on CT chest in 05/30/21. Overdue for 12 month follow-up, needs 2 years of stability then can transition to lung cancer screening program.    Glenford Bayley, NP 10/02/2022

## 2022-10-02 NOTE — Addendum Note (Signed)
Addended by: Glenford Bayley on: 10/02/2022 10:03 AM   Modules accepted: Orders

## 2022-10-02 NOTE — Assessment & Plan Note (Addendum)
-   Uncontrolled - Advised patient take pantoprazole 40mg  twice daily, follow GERD diet

## 2022-10-02 NOTE — Assessment & Plan Note (Deleted)
-   Take pantroprazole 40mg  twice daily, follow GERD diet

## 2022-10-02 NOTE — Patient Instructions (Addendum)
Recommendations: - Continue Singulair 10mg  bedtime - Start ipratropium nasal twice daily  - Use Wixella DAILY  - Take promethazine-dm 5ml every 6 hours as needed for cough suppression  - RESUME prantopraxole TWICE daily  - Follow GERD diet  - Call if above does not help cough and we will consider changing maintenance inhaler   Orders: - CT chest wo contrast (ordered)  Follow-up: - 3 months with Dr. Delton Coombes   Food Choices for Gastroesophageal Reflux Disease, Adult When you have gastroesophageal reflux disease (GERD), the foods you eat and your eating habits are very important. Choosing the right foods can help ease your discomfort. Think about working with a food expert (dietitian) to help you make good choices. What are tips for following this plan? Reading food labels Look for foods that are low in saturated fat. Foods that may help with your symptoms include: Foods that have less than 5% of daily value (DV) of fat. Foods that have 0 grams of trans fat. Cooking Do not fry your food. Cook your food by baking, steaming, grilling, or broiling. These are all methods that do not need a lot of fat for cooking. To add flavor, try to use herbs that are low in spice and acidity. Meal planning  Choose healthy foods that are low in fat, such as: Fruits and vegetables. Whole grains. Low-fat dairy products. Lean meats, fish, and poultry. Eat small meals often instead of eating 3 large meals each day. Eat your meals slowly in a place where you are relaxed. Avoid bending over or lying down until 2-3 hours after eating. Limit high-fat foods such as fatty meats or fried foods. Limit your intake of fatty foods, such as oils, butter, and shortening. Avoid the following as told by your doctor: Foods that cause symptoms. These may be different for different people. Keep a food diary to keep track of foods that cause symptoms. Alcohol. Drinking a lot of liquid with meals. Eating meals during the 2-3  hours before bed. Lifestyle Stay at a healthy weight. Ask your doctor what weight is healthy for you. If you need to lose weight, work with your doctor to do so safely. Exercise for at least 30 minutes on 5 or more days each week, or as told by your doctor. Wear loose-fitting clothes. Do not smoke or use any products that contain nicotine or tobacco. If you need help quitting, ask your doctor. Sleep with the head of your bed higher than your feet. Use a wedge under the mattress or blocks under the bed frame to raise the head of the bed. Chew sugar-free gum after meals. What foods should eat?  Eat a healthy, well-balanced diet of fruits, vegetables, whole grains, low-fat dairy products, lean meats, fish, and poultry. Each person is different. Foods that may cause symptoms in one person may not cause any symptoms in another person. Work with your doctor to find foods that are safe for you. The items listed above may not be a complete list of what you can eat and drink. Contact a food expert for more options. What foods should I avoid? Limiting some of these foods may help in managing the symptoms of GERD. Everyone is different. Talk with a food expert or your doctor to help you find the exact foods to avoid, if any. Fruits Any fruits prepared with added fat. Any fruits that cause symptoms. For some people, this may include citrus fruits, such as oranges, grapefruit, pineapple, and lemons. Vegetables Deep-fried vegetables. [place]  fries. Any vegetables prepared with added fat. Any vegetables that cause symptoms. For some people, this may include tomatoes and tomato products, chili peppers, onions and garlic, and horseradish. Grains Pastries or quick breads with added fat. Meats and other proteins High-fat meats, such as fatty beef or pork, hot dogs, ribs, ham, sausage, salami, and bacon. Fried meat or protein, including fried fish and fried chicken. Nuts and nut butters, in large  amounts. Dairy Whole milk and chocolate milk. Sour cream. Cream. Ice cream. Cream cheese. Milkshakes. Fats and oils Butter. Margarine. Shortening. Ghee. Beverages Coffee and tea, with or without caffeine. Carbonated beverages. Sodas. Energy drinks. Fruit juice made with acidic fruits, such as orange or grapefruit. Tomato juice. Alcoholic drinks. Sweets and desserts Chocolate and cocoa. Donuts. Seasonings and condiments Pepper. Peppermint and spearmint. Added salt. Any condiments, herbs, or seasonings that cause symptoms. For some people, this may include curry, hot sauce, or vinegar-based salad dressings. The items listed above may not be a complete list of what you should not eat and drink. Contact a food expert for more options. Questions to ask your doctor Diet and lifestyle changes are often the first steps that are taken to manage symptoms of GERD. If diet and lifestyle changes do not help, talk with your doctor about taking medicines. Where to find more information International Foundation for Gastrointestinal Disorders: aboutgerd.org Summary When you have GERD, food and lifestyle choices are very important in easing your symptoms. Eat small meals often instead of 3 large meals a day. Eat your meals slowly and in a place where you are relaxed. Avoid bending over or lying down until 2-3 hours after eating. Limit high-fat foods such as fatty meats or fried foods. This information is not intended to replace advice given to you by your health care provider. Make sure you discuss any questions you have with your health care provider. Document Revised: 10/05/2019 Document Reviewed: 10/05/2019 Elsevier Patient Education  2024 [employer].

## 2022-10-02 NOTE — Assessment & Plan Note (Addendum)
-   New voice hoarseness with upper airway cough x3 weeks. No improvement with oral prednisone. PND and GERD likely contributing. Lungs were clear on exam. No audible wheezing. Continue Wixella, prn Albuterol and Singulair. Recommend max treatment for GERD/PND. RX promethazine-dm 5ml every 6 hours for cough suppression. Needs updated PFT with BD challenge prior to next visit. FU in 3 months or sooner if needed.

## 2022-10-02 NOTE — Assessment & Plan Note (Signed)
-   Continue Singulair 10mg  at bedtime - Start ipratropium nasal spray q 12 hours

## 2022-10-02 NOTE — Assessment & Plan Note (Signed)
-   Smoking hx. Punctate nodules noted on CT chest in 05/30/21. Overdue for 12 month follow-up, needs 2 years of stability then can transition to lung cancer screening program.

## 2022-10-03 ENCOUNTER — Other Ambulatory Visit: Payer: BC Managed Care – PPO

## 2022-10-26 ENCOUNTER — Ambulatory Visit
Admission: RE | Admit: 2022-10-26 | Discharge: 2022-10-26 | Disposition: A | Discharge status: Home / Self Care | Payer: BC Managed Care – PPO | Source: Ambulatory Visit | Attending: Primary Care | Admitting: Primary Care

## 2022-10-26 DIAGNOSIS — R918 Other nonspecific abnormal finding of lung field: Secondary | ICD-10-CM

## 2022-10-31 ENCOUNTER — Telehealth: Payer: Self-pay | Admitting: Primary Care

## 2022-10-31 NOTE — Telephone Encounter (Signed)
Please call w/CT results. [phone]

## 2022-11-01 NOTE — Telephone Encounter (Signed)
Called and spoke with patient.  At this time CT chest 10/26/22 results have not been completed by Radiologist.  Patient made aware someone will call once results are available.  Nothing further at this time.

## 2022-11-06 NOTE — Progress Notes (Signed)
Please let patient know CT of her chest showed stable tiny bilateral pulmonary nodules, most consistent with benign granuloma.  No further imaging warranted.   Incidental findings of fatty liver, aortic arthrosclerosis and enlarged thyroid.  Make sure she follows up with primary care regarding these non-urgent findings if not already doing so.

## 2023-01-02 ENCOUNTER — Ambulatory Visit: Payer: BC Managed Care – PPO | Admitting: Emergency Medicine

## 2023-01-02 ENCOUNTER — Encounter: Payer: Self-pay | Admitting: Emergency Medicine

## 2023-01-02 VITALS — BP 140/78 | HR 77 | Ht 63.0 in | Wt 177.2 lb

## 2023-01-02 DIAGNOSIS — R053 Chronic cough: Secondary | ICD-10-CM

## 2023-01-02 DIAGNOSIS — J301 Allergic rhinitis due to pollen: Secondary | ICD-10-CM | POA: Diagnosis not present

## 2023-01-02 DIAGNOSIS — J4489 Other specified chronic obstructive pulmonary disease: Secondary | ICD-10-CM

## 2023-01-02 DIAGNOSIS — K219 Gastro-esophageal reflux disease without esophagitis: Secondary | ICD-10-CM

## 2023-01-02 LAB — PULMONARY FUNCTION TEST
DL/VA % pred: 132 %
DL/VA: 5.61 ml/min/mmHg/L
DLCO cor % pred: 116 %
DLCO cor: 23.23 ml/min/mmHg
DLCO unc % pred: 117 %
DLCO unc: 23.44 ml/min/mmHg
FEF 25-75 Post: 0.88 L/sec
FEF 25-75 Pre: 0.79 L/sec
FEF2575-%Change-Post: 11 %
FEF2575-%Pred-Post: 37 %
FEF2575-%Pred-Pre: 33 %
FEV1-%Change-Post: 5 %
FEV1-%Pred-Post: 53 %
FEV1-%Pred-Pre: 50 %
FEV1-Post: 1.36 L
FEV1-Pre: 1.29 L
FEV1FVC-%Change-Post: 2 %
FEV1FVC-%Pred-Pre: 71 %
FEV6-%Change-Post: 2 %
FEV6-%Pred-Post: 74 %
FEV6-%Pred-Pre: 72 %
FEV6-Post: 2.34 L
FEV6-Pre: 2.29 L
FEV6FVC-%Pred-Post: 103 %
FEV6FVC-%Pred-Pre: 103 %
FVC-%Change-Post: 3 %
FVC-%Pred-Post: 72 %
FVC-%Pred-Pre: 70 %
FVC-Post: 2.38 L
FVC-Pre: 2.3 L
Post FEV1/FVC ratio: 57 %
Post FEV6/FVC ratio: 100 %
Pre FEV1/FVC ratio: 56 %
Pre FEV6/FVC Ratio: 100 %
RV % pred: 111 %
RV: 2.17 L
TLC % pred: 90 %
TLC: 4.51 L

## 2023-01-02 NOTE — Patient Instructions (Addendum)
Your CT scan of the chest is stable.  We will do 1 final CT in 10/2023 to establish 2 years of stability.  If stable at that time then you will not need any further scans to follow small pulmonary nodules. Please continue Wixela once daily.  Depending on how your breathing is doing we may decide to increase this to 2 times a day.  Rinse and gargle after using. Keep your albuterol available use 2 puffs when needed for shortness of breath, chest tightness, wheezing. If you develop increased allergy symptoms that are resulting in cough and you should get back on your Singulair and your fluticasone nasal spray Okay to try stopping your pantoprazole.  If you have increased reflux, increased cough associated with this then you may have to go back on it. Follow with APP in 6 months to assess your status Follow Dr. Delton Coombes in 12 months, sooner if you have any problems.

## 2023-01-02 NOTE — Patient Instructions (Signed)
Full PFT performed today. °

## 2023-01-02 NOTE — Progress Notes (Signed)
Full PFT performed today. °

## 2023-01-02 NOTE — Assessment & Plan Note (Signed)
Continue to manage GERD, reflux.  Her cough has characteristics more consistent with upper airway irritation and her asthma.

## 2023-01-02 NOTE — Assessment & Plan Note (Signed)
If you develop increased allergy symptoms that are resulting in cough and you should get back on your Singulair and your fluticasone nasal spray

## 2023-01-02 NOTE — Assessment & Plan Note (Signed)
Okay to try stopping your pantoprazole.  If you have increased reflux, increased cough associated with this then you may have to go back on it.

## 2023-01-02 NOTE — Assessment & Plan Note (Signed)
Please continue Wixela once daily.  Depending on how your breathing is doing we may decide to increase this to 2 times a day.  Rinse and gargle after using. Keep your albuterol available use 2 puffs when needed for shortness of breath, chest tightness, wheezing. Follow with APP in 6 months to assess your status Follow Dr. Delton Coombes in 12 months, sooner if you have any problems.

## 2023-01-02 NOTE — Progress Notes (Signed)
Subjective:    Patient ID: Brenda Key, female    DOB: 05-09-1965, 57 y.o.   MRN: [id number]  HPI  ROV 07/11/21 --follow-up visit 57 year old woman with a history of tobacco use (20 pack years), smoking approximately.  She hands COPD/asthma, chronic cough with contributions GERD, chronic rhinitis.  She was seen 05/23/2021 in our office, treated for an acute bronchitis with doxycycline.  A chest x-ray has shown a reticular right upper lobe opacity which prompted a CT chest as below.  She was continued on omeprazole and Breztri.  She reports that the doxycycline cleared her chest some. Her GERD has been flaring, and then her cough. She stopped the Breztri - seemed to be hurting her chest. She is back on wixella now. Uses albuterol rarely.  She is off flonase, off singulair. She is using nasal saline spray.   CT chest 05/30/2021 reviewed by me, shows an enlarged left thyroid with multiple solid nodules, small scattered subpleural nodules measuring up to 3 mm at the left base.  A thyroid ultrasound was recommended.   ROV 01/02/2023 --57 year old woman whom I last saw in 2023.  She has a history of tobacco use, COPD/asthma, chronic cough with GERD and allergic rhinitis.  Also with small scattered subpleural pulmonary nodules.  She has a new diagnosis of mild obstructive sleep apnea as well.  Last seen in our office 09/2022 for voice hoarseness and cough.  She was treated prior to that visit with prednisone.  She improved after about 3 weeks. She got better with atrovent NS and PPI bid, now on qd. Uses flonase and singulair only prn. She is currently managed on Wixela, usually once a day. Rare albuterol. No other flares. Good functional capacity.   CT chest 10/26/2022 reviewed by me, shows no interval change and tiny bilateral pulmonary nodules with some calcification consistent with granulomatous disease.  Pulmonary function testing performed today and reviewed by me shows evidence for severe obstruction  without a positive bronchodilator response.  She has normal lung volumes with suggest superimposed restriction (pseudonormalization).  She has normal diffusion capacity.   Review of Systems As per HPI     Objective:   Physical Exam  Vitals:   01/02/23 1118  BP: (!) 140/78  Pulse: 77  SpO2: 96%  Weight: 177 lb 3.2 oz (80.4 kg)  Height: 5\' 3"  (1.6 m)    Gen: Pleasant, obese woman, in no distress,  normal affect  ENT: No lesions,  mouth clear,  oropharynx clear, no postnasal drip,  Neck: No JVD, no stridor  Lungs: No use of accessory muscles, no crackles or wheezing on normal respiration, no wheeze on forced expiration  Cardiovascular: RRR, heart sounds normal, no murmur or gallops, no peripheral edema  Musculoskeletal: No deformities, no cyanosis or clubbing  Neuro: alert, awake, non focal  Skin: Warm, no lesions or rash     Assessment & Plan:   COPD with asthma (HCC) Please continue Wixela once daily.  Depending on how your breathing is doing we may decide to increase this to 2 times a day.  Rinse and gargle after using. Keep your albuterol available use 2 puffs when needed for shortness of breath, chest tightness, wheezing. Follow with APP in 6 months to assess your status Follow Dr. Delton Coombes in 12 months, sooner if you have any problems.  Chronic cough Continue to manage GERD, reflux.  Her cough has characteristics more consistent with upper airway irritation and her asthma.  GERD (gastroesophageal reflux disease) Okay to  try stopping your pantoprazole.  If you have increased reflux, increased cough associated with this then you may have to go back on it.  Rhinitis If you develop increased allergy symptoms that are resulting in cough and you should get back on your Singulair and your fluticasone nasal spray   Levy Pupa, MD, PhD 01/02/2023, 4:36 PM [facility] [phone] or if no answer before 7:00PM call [phone] For any issues  after 7:00PM please call eLink [phone]

## 2023-01-03 ENCOUNTER — Other Ambulatory Visit: Payer: Self-pay | Admitting: Emergency Medicine

## 2023-07-01 IMAGING — DX DG CHEST 2V
2 series · 2 of 2 positions shown · non-contrast
Comparison: None.

CLINICAL DATA: Chronic cough.

EXAM:
CHEST - 2 VIEW

[chest pa]
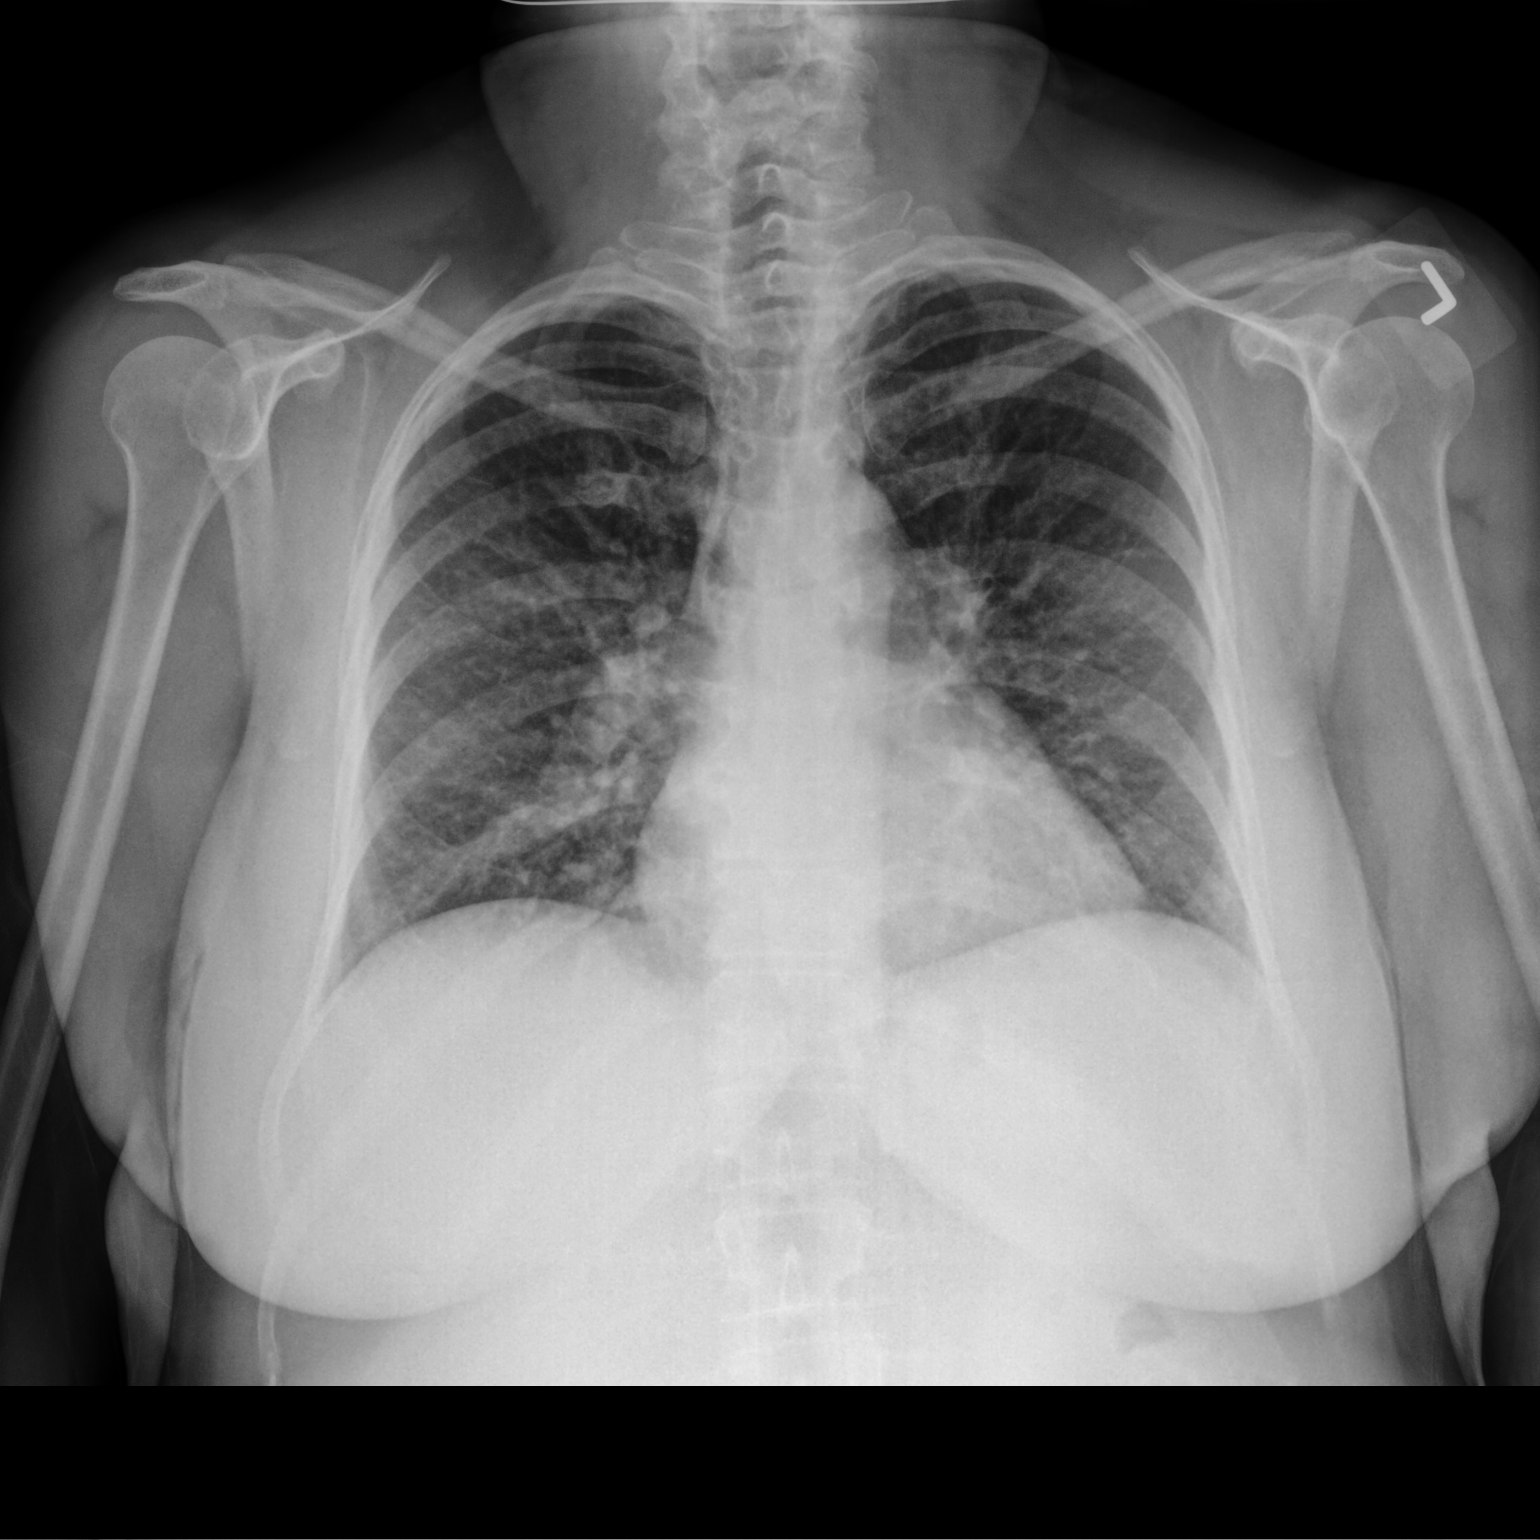

[chest lat]
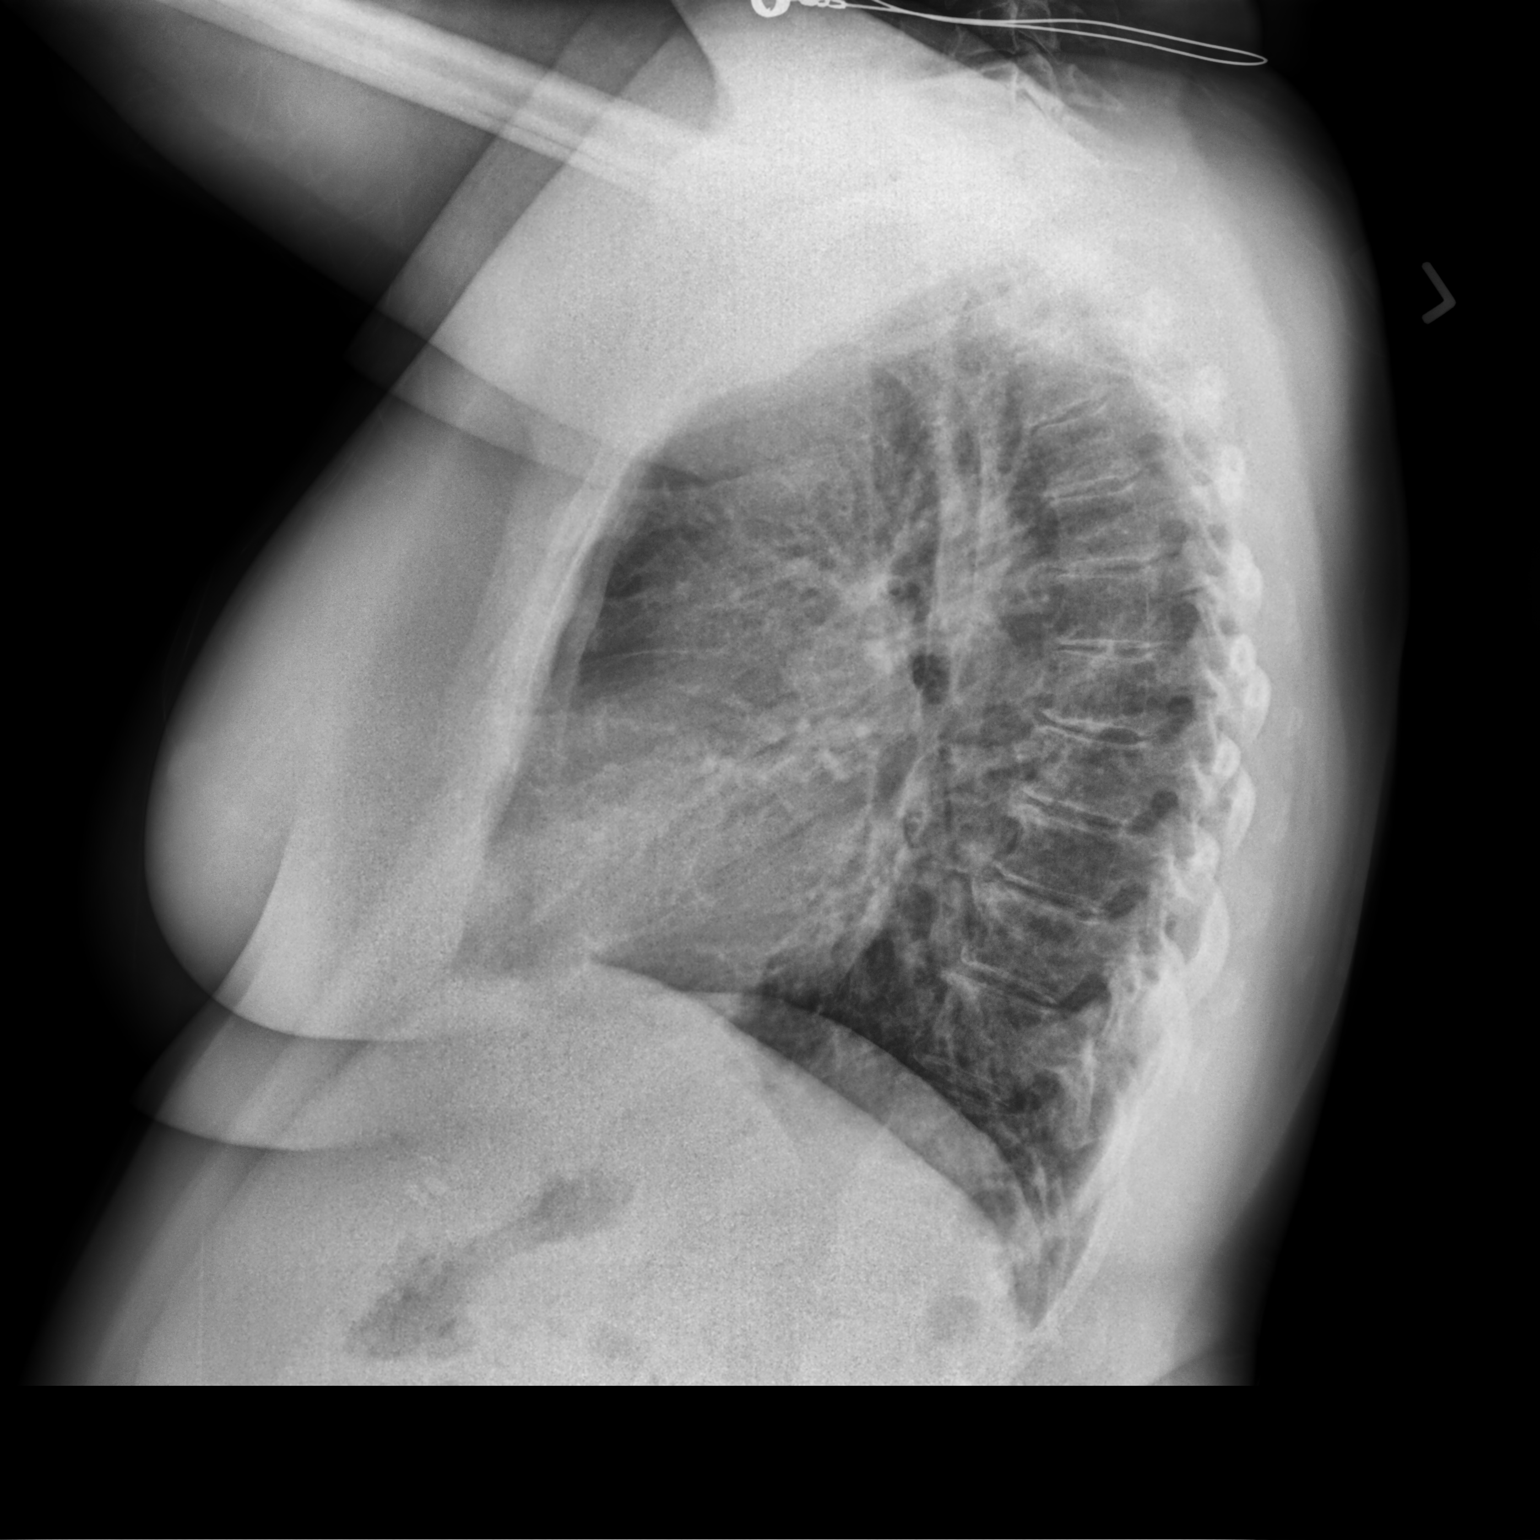

[2 of 2 positions shown; findings below may reference images not displayed]

FINDINGS: Reticulonodular interstitial disease of bilateral lungs. No focal
consolidation. No pleural effusion or pneumothorax. Heart and
mediastinal contours are unremarkable.

No acute osseous abnormality.
IMPRESSION: Reticulonodular interstitial disease of bilateral lungs which may
reflect chronic interstitial disease versus interstitial pneumonia.

## 2023-09-09 ENCOUNTER — Other Ambulatory Visit: Payer: Self-pay | Admitting: Obstetrics and Gynecology

## 2023-09-09 DIAGNOSIS — R928 Other abnormal and inconclusive findings on diagnostic imaging of breast: Secondary | ICD-10-CM

## 2023-09-10 ENCOUNTER — Other Ambulatory Visit: Payer: Self-pay

## 2023-09-12 ENCOUNTER — Ambulatory Visit
Admission: RE | Admit: 2023-09-12 | Discharge: 2023-09-12 | Disposition: A | Discharge status: Home / Self Care | Payer: Self-pay | Source: Ambulatory Visit | Attending: Obstetrics and Gynecology | Admitting: Obstetrics and Gynecology

## 2023-09-12 ENCOUNTER — Ambulatory Visit: Payer: Self-pay

## 2023-09-12 DIAGNOSIS — R928 Other abnormal and inconclusive findings on diagnostic imaging of breast: Secondary | ICD-10-CM

## 2023-09-18 ENCOUNTER — Other Ambulatory Visit: Payer: Self-pay

## 2023-12-14 ENCOUNTER — Other Ambulatory Visit: Payer: Self-pay | Admitting: Primary Care

## 2024-01-05 ENCOUNTER — Other Ambulatory Visit: Payer: Self-pay | Admitting: Emergency Medicine

## 2024-01-06 NOTE — Telephone Encounter (Signed)
 Courtesy refill - pt will need appt for further refills.

## 2024-02-01 ENCOUNTER — Other Ambulatory Visit: Payer: Self-pay | Admitting: Emergency Medicine
